# Patient Record
Sex: Female | Born: 1945 | Race: White | Hispanic: No | Smoking: Current every day smoker
Health system: Southern US, Community
[De-identification: ages and names within clinical notes are randomized; demographics above are authoritative.]

---

## 2014-01-19 ENCOUNTER — Emergency Department (HOSPITAL_BASED_OUTPATIENT_CLINIC_OR_DEPARTMENT_OTHER): Payer: Medicare PPO

## 2014-01-19 ENCOUNTER — Emergency Department (HOSPITAL_BASED_OUTPATIENT_CLINIC_OR_DEPARTMENT_OTHER)
Admission: EM | Admit: 2014-01-19 | Discharge: 2014-01-19 | Disposition: A | Discharge status: Home / Self Care | Payer: Medicare PPO | Attending: Emergency Medicine | Admitting: Emergency Medicine

## 2014-01-19 ENCOUNTER — Encounter (HOSPITAL_BASED_OUTPATIENT_CLINIC_OR_DEPARTMENT_OTHER): Payer: Self-pay | Admitting: Emergency Medicine

## 2014-01-19 DIAGNOSIS — Z7982 Long term (current) use of aspirin: Secondary | ICD-10-CM | POA: Insufficient documentation

## 2014-01-19 DIAGNOSIS — F17200 Nicotine dependence, unspecified, uncomplicated: Secondary | ICD-10-CM | POA: Insufficient documentation

## 2014-01-19 DIAGNOSIS — N390 Urinary tract infection, site not specified: Secondary | ICD-10-CM | POA: Insufficient documentation

## 2014-01-19 LAB — CBC WITH DIFFERENTIAL/PLATELET
Basophils Absolute: 0 10*3/uL (ref 0.0–0.1)
Basophils Relative: 0 % (ref 0–1)
EOS ABS: 0 10*3/uL (ref 0.0–0.7)
Eosinophils Relative: 0 % (ref 0–5)
HCT: 43 % (ref 36.0–46.0)
Hemoglobin: 14.7 g/dL (ref 12.0–15.0)
LYMPHS ABS: 1.7 10*3/uL (ref 0.7–4.0)
LYMPHS PCT: 18 % (ref 12–46)
MCH: 31.5 pg (ref 26.0–34.0)
MCHC: 34.2 g/dL (ref 30.0–36.0)
MCV: 92.1 fL (ref 78.0–100.0)
Monocytes Absolute: 1.1 10*3/uL — ABNORMAL HIGH (ref 0.1–1.0)
Monocytes Relative: 11 % (ref 3–12)
Neutro Abs: 6.6 10*3/uL (ref 1.7–7.7)
Neutrophils Relative %: 71 % (ref 43–77)
PLATELETS: 246 10*3/uL (ref 150–400)
RBC: 4.67 MIL/uL (ref 3.87–5.11)
RDW: 12.9 % (ref 11.5–15.5)
WBC: 9.4 10*3/uL (ref 4.0–10.5)

## 2014-01-19 LAB — COMPREHENSIVE METABOLIC PANEL
ALK PHOS: 102 U/L (ref 39–117)
ALT: 15 U/L (ref 0–35)
AST: 20 U/L (ref 0–37)
Albumin: 3.5 g/dL (ref 3.5–5.2)
BUN: 8 mg/dL (ref 6–23)
CO2: 24 mEq/L (ref 19–32)
Calcium: 9.1 mg/dL (ref 8.4–10.5)
Chloride: 93 mEq/L — ABNORMAL LOW (ref 96–112)
Creatinine, Ser: 0.8 mg/dL (ref 0.50–1.10)
GFR calc Af Amer: 86 mL/min — ABNORMAL LOW (ref >90)
GFR calc non Af Amer: 75 mL/min — ABNORMAL LOW (ref >90)
GLUCOSE: 112 mg/dL — AB (ref 70–99)
POTASSIUM: 4 meq/L (ref 3.7–5.3)
SODIUM: 131 meq/L — AB (ref 137–147)
Total Bilirubin: 0.3 mg/dL (ref 0.3–1.2)
Total Protein: 7.4 g/dL (ref 6.0–8.3)

## 2014-01-19 LAB — URINALYSIS, ROUTINE W REFLEX MICROSCOPIC
GLUCOSE, UA: NEGATIVE mg/dL
HGB URINE DIPSTICK: NEGATIVE
KETONES UR: NEGATIVE mg/dL
Nitrite: NEGATIVE
PH: 5.5 (ref 5.0–8.0)
Protein, ur: NEGATIVE mg/dL
Specific Gravity, Urine: 1.027 (ref 1.005–1.030)
Urobilinogen, UA: 1 mg/dL (ref 0.0–1.0)

## 2014-01-19 LAB — URINE MICROSCOPIC-ADD ON

## 2014-01-19 MED ORDER — CEPHALEXIN 250 MG PO CAPS
500.0000 mg | ORAL_CAPSULE | Freq: Once | ORAL | Status: AC
Start: 2014-01-19 — End: 2014-01-19
  Administered 2014-01-19: 500 mg via ORAL
  Filled 2014-01-19: qty 2

## 2014-01-19 MED ORDER — CEPHALEXIN 500 MG PO CAPS: 500.0000 mg | ORAL_CAPSULE | Freq: Four times a day (QID) | ORAL | Status: AC

## 2014-01-19 NOTE — ED Provider Notes (Signed)
CSN: [id number]     Arrival date & time 01/19/14  1710 History   First MD Initiated Contact with Patient 01/19/14 1717     Chief Complaint  Patient presents with  . Fever     (Consider location/radiation/quality/duration/timing/severity/associated sxs/prior Treatment) HPI 68 yo. Female complaining of fever for two days- states subjective.  Denies other complaints except feels weak and tired with fever.  Symptoms improve with tylenol- last tylenol 2 hours pte.  She is a smoker but has cut back with fever, Denies headache, nasal drainage, cough, dyspnea, nausea, vomiting, abdominal pain, diarrhea,or rash.  She states throat somewhat scratchy this am.  No known sick contacts or recent travel.  History reviewed. No pertinent past medical history. History reviewed. No pertinent past surgical history. No family history on file. History  Substance Use Topics  . Smoking status: Current Every Day Smoker  . Smokeless tobacco: Not on file  . Alcohol Use: Yes   OB History   Grav Para Term Preterm Abortions TAB SAB Ect Mult Living                 Review of Systems  All other systems reviewed and are negative.     Allergies  Review of patient's allergies indicates no known allergies.  Home Medications   Prior to Admission medications   Medication Sig Start Date End Date Taking? Authorizing Provider  aspirin 81 MG tablet Take 81 mg by mouth daily.   Yes Historical Provider, MD  B Complex Vitamins (VITAMIN B COMPLEX PO) Take by mouth.   Yes Historical Provider, MD  Cholecalciferol (VITAMIN D PO) Take by mouth.   Yes Historical Provider, MD   BP 141/68  Pulse 98  Temp(Src) 100.3 F (37.9 C) (Oral)  Resp 20  Ht 5' 5.5" (1.664 m)  Wt 170 lb (77.111 kg)  BMI 27.85 kg/m2  SpO2 97% Physical Exam  Nursing note and vitals reviewed. Constitutional: She is oriented to person, place, and time. She appears well-developed and well-nourished.  HENT:  Head: Normocephalic and atraumatic.   Right Ear: External ear normal.  Left Ear: External ear normal.  Nose: Nose normal.  Mouth/Throat: Oropharynx is clear and moist.  Eyes: Conjunctivae and EOM are normal. Pupils are equal, round, and reactive to light.  Neck: Normal range of motion. Neck supple.  Cardiovascular: Normal rate, regular rhythm and normal heart sounds.   Abdominal: Soft. Bowel sounds are normal.  Musculoskeletal: She exhibits tenderness.  Some clubbing of fingers  Neurological: She is alert and oriented to person, place, and time. She has normal reflexes. She displays normal reflexes. No cranial nerve deficit. She exhibits normal muscle tone. Coordination normal.  Skin: No rash noted.  Psychiatric: She has a normal mood and affect. Her behavior is normal. Judgment and thought content normal.    ED Course  Procedures (including critical care time) Labs Review Labs Reviewed  CBC WITH DIFFERENTIAL - Abnormal; Notable for the following:    Monocytes Absolute 1.1 (*)    All other components within normal limits  COMPREHENSIVE METABOLIC PANEL - Abnormal; Notable for the following:    Sodium 131 (*)    Chloride 93 (*)    Glucose, Bld 112 (*)    GFR calc non Af Amer 75 (*)    GFR calc Af Amer 86 (*)    All other components within normal limits  URINALYSIS, ROUTINE W REFLEX MICROSCOPIC - Abnormal; Notable for the following:    Color, Urine AMBER (*)  APPearance CLOUDY (*)    Bilirubin Urine SMALL (*)    Leukocytes, UA SMALL (*)    All other components within normal limits  URINE MICROSCOPIC-ADD ON - Abnormal; Notable for the following:    Squamous Epithelial / LPF FEW (*)    Bacteria, UA MANY (*)    All other components within normal limits    Imaging Review Dg Chest 2 View  01/19/2014   CLINICAL DATA:  Fever  EXAM: CHEST  2 VIEW  COMPARISON:  Feb 01, 2006  FINDINGS: Lungs are clear. Heart size and pulmonary vascularity are normal. No adenopathy. There is atherosclerotic change in the aorta. No bone  lesions.  IMPRESSION: No edema or consolidation.   Electronically Signed   By: Bretta BangWilliam  Woodruff M.D.   On: 01/19/2014 18:11     EKG Interpretation None      MDM   Final diagnoses:  None    Patient with fever and no specific symptoms except some scratchy throat and some urinary frequency that she thinks is secondary to increased po intake.  Plan keflex and urine to be cultured. Patient advised regarding need for close follow up with pmd and return precautions and voices understanding.  Patient counseled regarding smoking cessation.     Hilario Quarryanielle S Zorianna Taliaferro, MD 01/19/14 [phone]

## 2014-01-19 NOTE — ED Notes (Signed)
Patient asked to change into gown. 

## 2014-01-19 NOTE — ED Notes (Signed)
MD at bedside. 

## 2014-01-19 NOTE — Discharge Instructions (Signed)
Fever, Adult A fever is a higher than normal body temperature. In an adult, an oral temperature around 98.6 F (37 C) is considered normal. A temperature of 100.4 F (38 C) or higher is generally considered a fever. Mild or moderate fevers generally have no long-term effects and often do not require treatment. Extreme fever (greater than or equal to 106 F or 41.1 C) can cause seizures. The sweating that may occur with repeated or prolonged fever may cause dehydration. Elderly people can develop confusion during a fever. A measured temperature can vary with:  Age.  Time of day.  Method of measurement (mouth, underarm, rectal, or ear). The fever is confirmed by taking a temperature with a thermometer. Temperatures can be taken different ways. Some methods are accurate and some are not.  An oral temperature is used most commonly. Electronic thermometers are fast and accurate.  An ear temperature will only be accurate if the thermometer is positioned as recommended by the manufacturer.  A rectal temperature is accurate and done for those adults who have a condition where an oral temperature cannot be taken.  An underarm (axillary) temperature is not accurate and not recommended. Fever is a symptom, not a disease.  CAUSES   Infections commonly cause fever.  Some noninfectious causes for fever include:  Some arthritis conditions.  Some thyroid or adrenal gland conditions.  Some immune system conditions.  Some types of cancer.  A medicine reaction.  High doses of certain street drugs such as methamphetamine.  Dehydration.  Exposure to high outside or room temperatures.  Occasionally, the source of a fever cannot be determined. This is sometimes called a "fever of unknown origin" (FUO).  Some situations may lead to a temporary rise in body temperature that may go away on its own. Examples are:  Childbirth.  Surgery.  Intense exercise. HOME CARE INSTRUCTIONS   Take  appropriate medicines for fever. Follow dosing instructions carefully. If you use acetaminophen to reduce the fever, be careful to avoid taking other medicines that also contain acetaminophen. Do not take aspirin for a fever if you are younger than age 59. There is an association with Reye's syndrome. Reye's syndrome is a rare but potentially deadly disease.  If an infection is present and antibiotics have been prescribed, take them as directed. Finish them even if you start to feel better.  Rest as needed.  Maintain an adequate fluid intake. To prevent dehydration during an illness with prolonged or recurrent fever, you may need to drink extra fluid.Drink enough fluids to keep your urine clear or pale yellow.  Sponging or bathing with room temperature water may help reduce body temperature. Do not use ice water or alcohol sponge baths.  Dress comfortably, but do not over-bundle. SEEK MEDICAL CARE IF:   You are unable to keep fluids down.  You develop vomiting or diarrhea.  You are not feeling at least partly better after 3 days.  You develop new symptoms or problems. SEEK IMMEDIATE MEDICAL CARE IF:   You have shortness of breath or trouble breathing.  You develop excessive weakness.  You are dizzy or you faint.  You are extremely thirsty or you are making little or no urine.  You develop new pain that was not there before (such as in the head, neck, chest, back, or abdomen).  You have persistant vomiting and diarrhea for more than 1 to 2 days.  You develop a stiff neck or your eyes become sensitive to light.  You develop a  skin rash.  You have a fever or persistent symptoms for more than 2 to 3 days.  You have a fever and your symptoms suddenly get worse. MAKE SURE YOU:   Understand these instructions.  Will watch your condition.  Will get help right away if you are not doing well or get worse. Document Released: 03/13/2001 Document Revised: 12/10/2011 Document  Reviewed: 07/19/2011 [facility] Patient Information 2014 [place], [place]. Urinary Tract Infection A urinary tract infection (UTI) can occur any place along the urinary tract. The tract includes the kidneys, ureters, bladder, and urethra. A type of germ called bacteria often causes a UTI. UTIs are often helped with antibiotic medicine.  HOME CARE   If given, take antibiotics as told by your doctor. Finish them even if you start to feel better.  Drink enough fluids to keep your pee (urine) clear or pale yellow.  Avoid tea, drinks with caffeine, and bubbly (carbonated) drinks.  Pee often. Avoid holding your pee in for a long time.  Pee before and after having sex (intercourse).  Wipe from front to back after you poop (bowel movement) if you are a woman. Use each tissue only once. GET HELP RIGHT AWAY IF:   You have back pain.  You have lower belly (abdominal) pain.  You have chills.  You feel sick to your stomach (nauseous).  You throw up (vomit).  Your burning or discomfort with peeing does not go away.  You have a fever.  Your symptoms are not better in 3 days. MAKE SURE YOU:   Understand these instructions.  Will watch your condition.  Will get help right away if you are not doing well or get worse. Document Released: 03/05/2008 Document Revised: 06/11/2012 Document Reviewed: 04/17/2012 [facility] Patient Information 2014 [place], [place].

## 2014-01-19 NOTE — ED Notes (Signed)
Intermittent fever since 4/19-denies other c/o-taking tylenol-last dose 315pm

## 2014-01-19 NOTE — ED Notes (Signed)
Patient is resting comfortably. 

## 2014-01-20 ENCOUNTER — Telehealth (HOSPITAL_BASED_OUTPATIENT_CLINIC_OR_DEPARTMENT_OTHER): Payer: Self-pay | Admitting: Emergency Medicine

## 2014-01-21 NOTE — ED Notes (Signed)
[facility] x [facility] [address] per Dr Fredderick PhenixBelfi, pt aware.

## 2015-05-06 IMAGING — CR DG CHEST 2V
2 series · 2 of 2 positions shown · non-contrast
Comparison: February 01, 2006

CLINICAL DATA: Fever

EXAM:
CHEST  2 VIEW

[w chest pa]
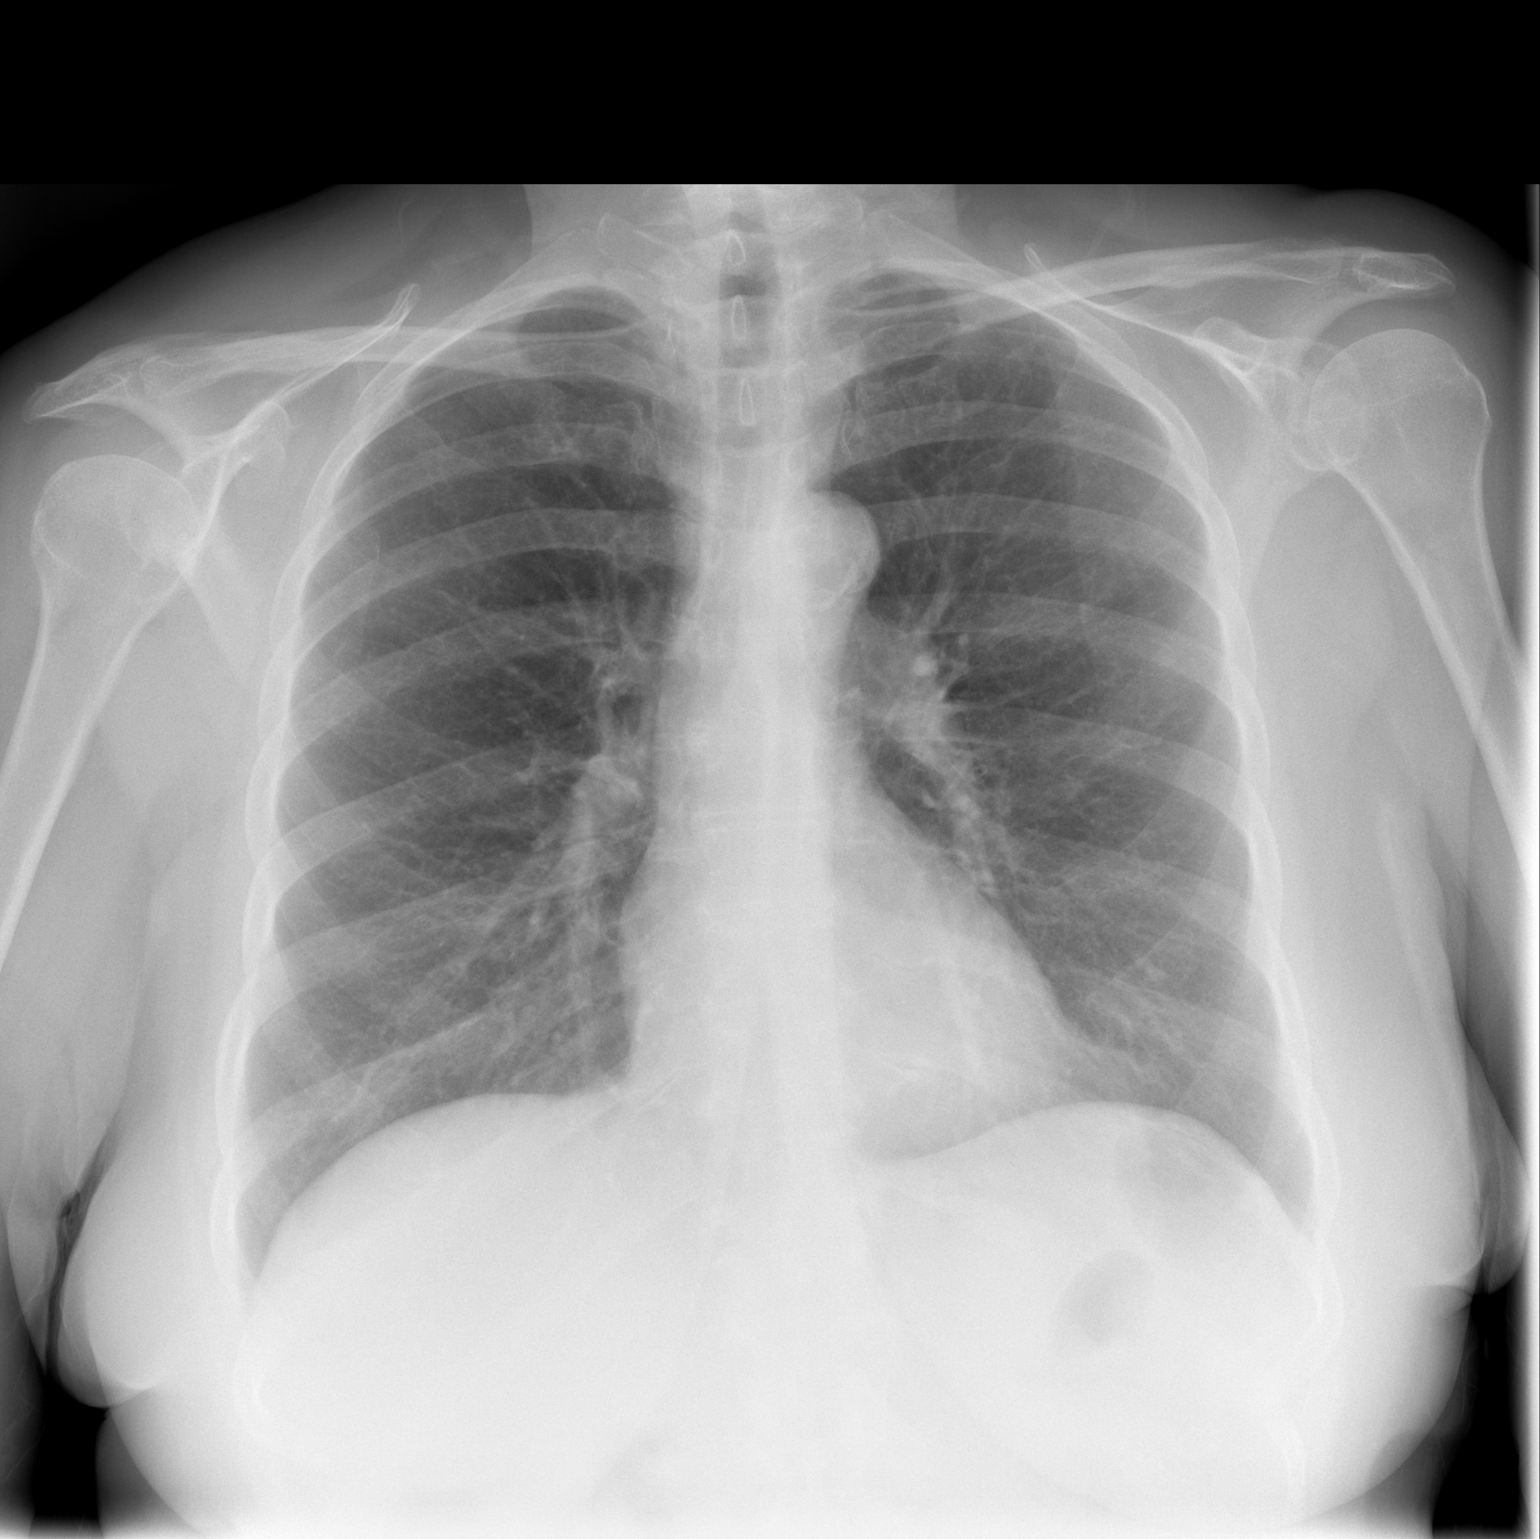

[w chest lat]
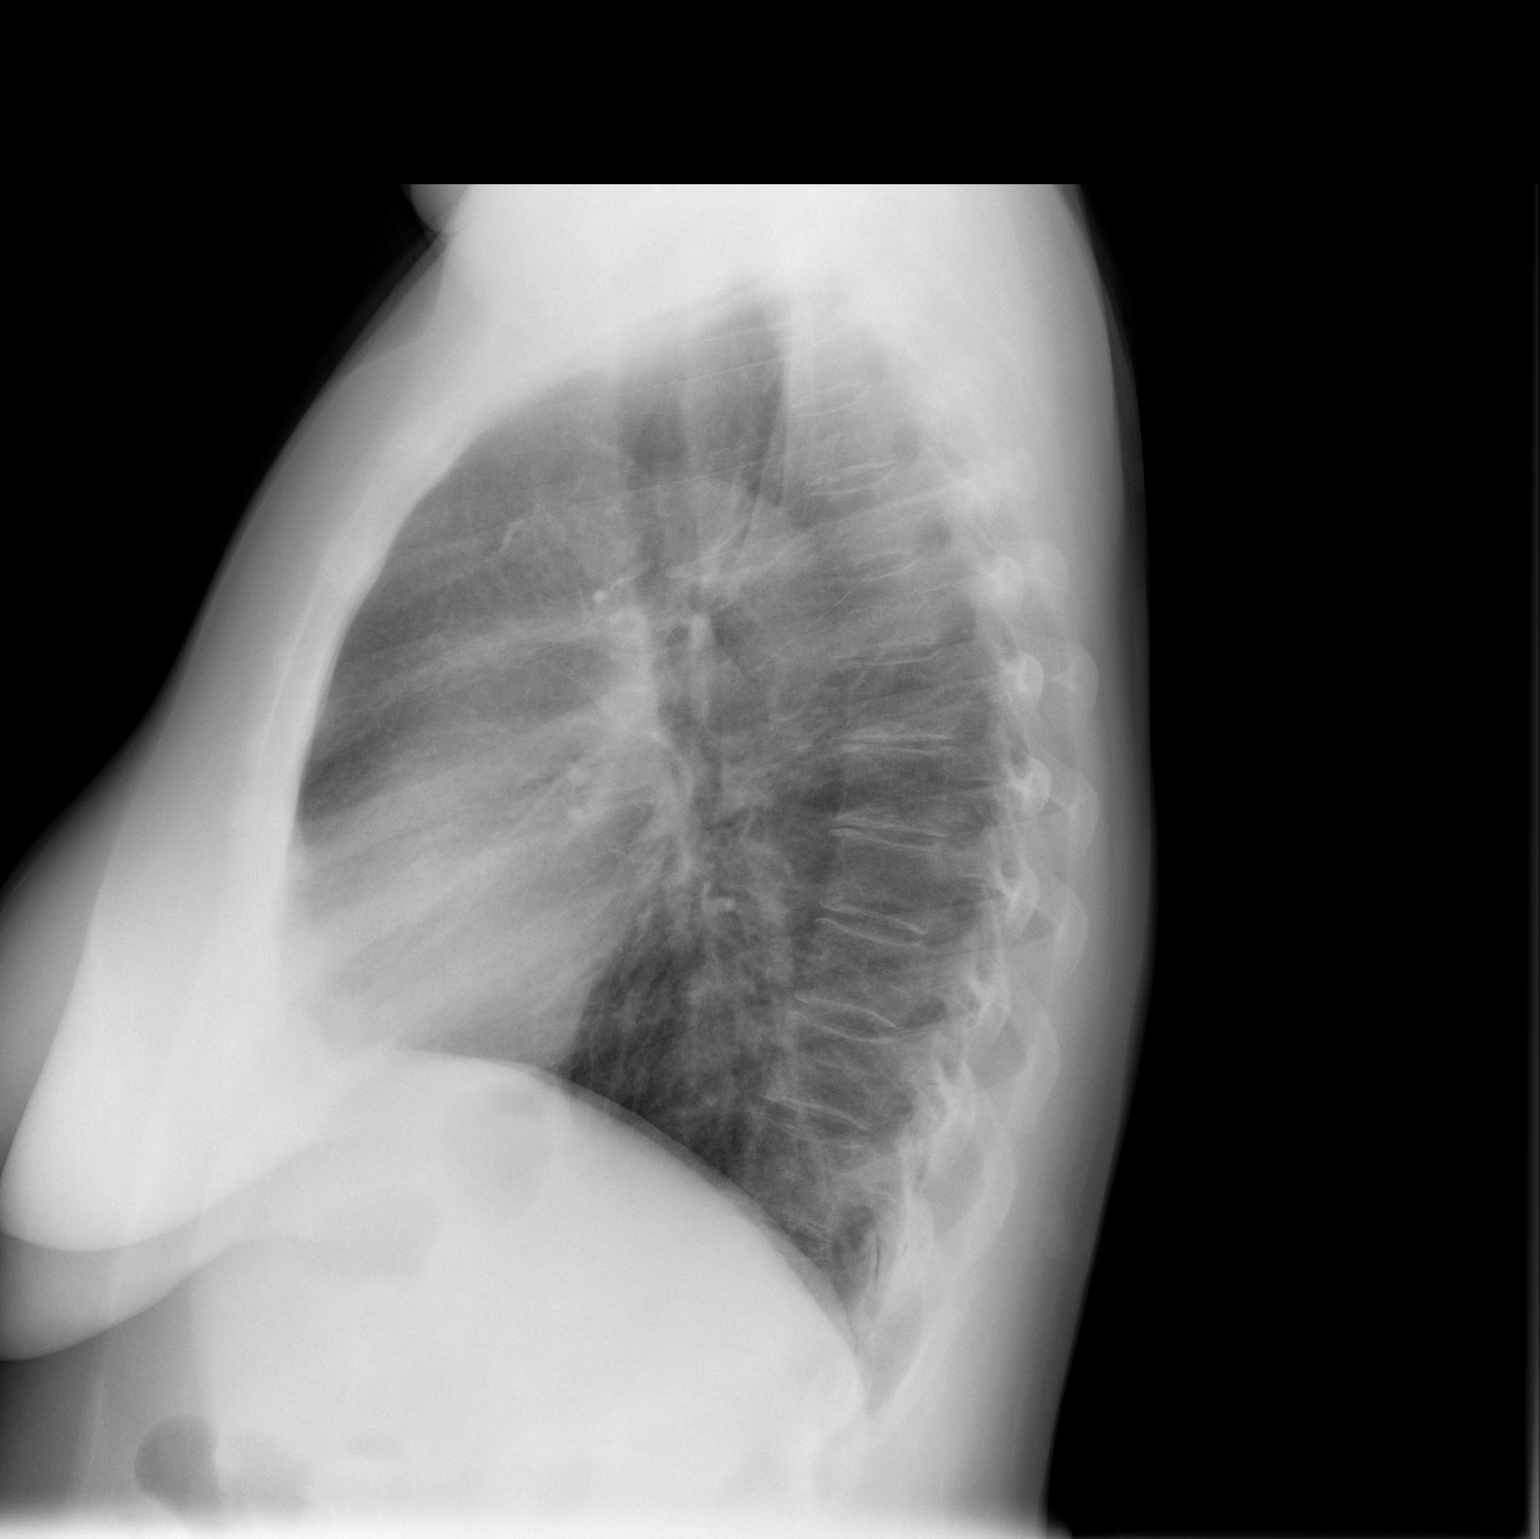

[2 of 2 positions shown; findings below may reference images not displayed]

FINDINGS: Lungs are clear. Heart size and pulmonary vascularity are normal. No
adenopathy. There is atherosclerotic change in the aorta. No bone
lesions.
IMPRESSION: No edema or consolidation.

## 2020-11-01 DEATH — deceased
